# Patient Record
Sex: Male | Born: 1990 | Race: White | Hispanic: No | Smoking: Never smoker
Health system: Southern US, Community
[De-identification: ages and names within clinical notes are randomized; demographics above are authoritative.]

## PROBLEM LIST (undated history)

## (undated) DIAGNOSIS — M1990 Unspecified osteoarthritis, unspecified site: Secondary | ICD-10-CM

## (undated) DIAGNOSIS — J020 Streptococcal pharyngitis: Secondary | ICD-10-CM

---
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
## 2011-07-22 ENCOUNTER — Emergency Department: Payer: Self-pay | Admitting: Emergency Medicine

## 2011-07-27 ENCOUNTER — Emergency Department: Payer: Self-pay | Admitting: *Deleted

## 2011-09-29 ENCOUNTER — Emergency Department: Payer: Self-pay | Admitting: Emergency Medicine

## 2011-11-19 ENCOUNTER — Ambulatory Visit: Payer: Self-pay | Admitting: Physical Medicine and Rehabilitation

## 2012-09-27 ENCOUNTER — Emergency Department: Payer: Self-pay | Admitting: Emergency Medicine

## 2014-01-10 ENCOUNTER — Emergency Department: Payer: Self-pay | Admitting: Emergency Medicine

## 2014-09-12 DIAGNOSIS — R0989 Other specified symptoms and signs involving the circulatory and respiratory systems: Secondary | ICD-10-CM | POA: Insufficient documentation

## 2014-09-12 NOTE — ED Notes (Signed)
Pt to ED states that he "feels like something is in his throat." pt denies a sore throat, difficulty breathing, or difficulty swallowing.

## 2014-09-13 ENCOUNTER — Emergency Department
Admission: EM | Admit: 2014-09-13 | Discharge: 2014-09-13 | Discharge status: Against Medical Advice | Payer: Self-pay | Attending: Emergency Medicine | Admitting: Emergency Medicine

## 2014-09-13 ENCOUNTER — Encounter: Payer: Self-pay | Admitting: *Deleted

## 2014-09-13 ENCOUNTER — Emergency Department: Payer: Self-pay

## 2014-09-13 ENCOUNTER — Emergency Department
Admission: EM | Admit: 2014-09-13 | Discharge: 2014-09-13 | Disposition: A | Discharge status: Home / Self Care | Payer: Self-pay | Attending: Emergency Medicine | Admitting: Emergency Medicine

## 2014-09-13 DIAGNOSIS — F458 Other somatoform disorders: Secondary | ICD-10-CM | POA: Insufficient documentation

## 2014-09-13 DIAGNOSIS — R0989 Other specified symptoms and signs involving the circulatory and respiratory systems: Secondary | ICD-10-CM

## 2014-09-13 HISTORY — DX: Streptococcal pharyngitis: J02.0

## 2014-09-13 HISTORY — DX: Unspecified osteoarthritis, unspecified site: M19.90

## 2014-09-13 LAB — POCT RAPID STREP A: STREPTOCOCCUS, GROUP A SCREEN (DIRECT): NEGATIVE

## 2014-09-13 NOTE — ED Notes (Signed)
Obtained rapid strep study in triage. Test negative.  sample also sent to lab

## 2014-09-13 NOTE — ED Provider Notes (Signed)
[facility] Emergency Department Provider Note ____________________________________________  Time seen: 1048  I have reviewed the triage vital signs and the nursing notes.  HISTORY  Chief Complaint  Foreign Body  HPI Jonathan Hines is a 24 y.o. male who reports to the ED with 2 days complaint of fullness to the throat. He he describes a foreign body sensation to the left side of his throat, below his Adam's apple. He denies that the area is painful, but it feels as though there is something hanging in his throat. He reports at times he can cough and the foreign body seems to rise up to just behind the tongue, but he is unable to expectorate this foreign body. He denies any recent fevers, chills, sweats, or cough. He has not had any difficulty controlling his secretions, eating, or drinking. He has taken any particular medicines for this complaint and notes only temporary relief with warm milk.  Past Medical History  Diagnosis Date  . Strep throat   . Arthritis    There are no active problems to display for this patient.  No past surgical history on file.  No current outpatient prescriptions on file.  Allergies Review of patient's allergies indicates no known allergies.  No family history on file.  Social History History  Substance Use Topics  . Smoking status: Never Smoker   . Smokeless tobacco: Not on file  . Alcohol Use: Yes   Review of Systems  Constitutional: Negative for fever. Eyes: Negative for visual changes. ENT: Negative for sore throat. FBS as above Cardiovascular: Negative for chest pain. Respiratory: Negative for shortness of breath. Gastrointestinal: Negative for abdominal pain, vomiting and diarrhea. Genitourinary: Negative for dysuria. Musculoskeletal: Negative for back pain. Skin: Negative for rash. Neurological: Negative for headaches, focal weakness or numbness. ____________________________________________  PHYSICAL  EXAM:  VITAL SIGNS: ED Triage Vitals  Enc Vitals Group     BP 09/13/14 1002 134/80 mmHg     Pulse Rate 09/13/14 1002 60     Resp 09/13/14 1002 18     Temp 09/13/14 1002 98.1 F (36.7 C)     Temp Source 09/13/14 1002 Oral     SpO2 09/13/14 1002 99 %     Weight 09/13/14 1002 187 lb (84.823 kg)     Height 09/13/14 1002  (1.88 m)     Head Cir --      Peak Flow --      Pain Score --      Pain Loc --      Pain Edu? --      Excl. in GC? --    Constitutional: Alert and oriented. Well appearing and in no distress. Eyes: Conjunctivae are normal. PERRL. Normal extraocular movements. ENT   Head: Normocephalic and atraumatic.   Nose: No congestion/rhinnorhea.   Mouth/Throat: Mucous membranes are moist. Uvula is midline without edema. Tonsils are large, but without edema, exudate, or injection.    Neck: Supple. No thyromegaly. Hematological/Lymphatic/Immunilogical: No cervical lymphadenopathy. Cardiovascular: Normal rate, regular rhythm.  Respiratory: Normal respiratory effort. No wheezes/rales/rhonchi. Gastrointestinal: Soft and nontender. No distention. Musculoskeletal: Nontender with normal range of motion in all extremities.  Neurologic:  CN II-XII grossly intact. Normal gait without ataxia. Normal speech and language. No gross focal neurologic deficits are appreciated. Skin:  Skin is warm, dry and intact. No rash noted. Psychiatric: Mood and affect are normal. Patient exhibits appropriate insight and judgment. ____________________________________________   RADIOLOGY  Soft Tissue Neck IMPRESSION: Negative.  I, Munirah Doerner,  Charlesetta Ivory, personally viewed and evaluated these images as part of my medical decision making.  ____________________________________________  INITIAL IMPRESSION / ASSESSMENT AND PLAN / ED COURSE  Radiology results to patient. Reassurance about likely spontaneous resolution of globus sensation. Within normal limits exam without evidence of  dysphagia, abscess, or aspiration. Suggest follow-up with ENT if symptoms worsen.  ____________________________________________  FINAL CLINICAL IMPRESSION(S) / ED DIAGNOSES  Final diagnoses:  Globus sensation     Lissa Hoard, PA-C 09/13/14 1159  Emily Filbert, MD 09/13/14 [phone]

## 2014-09-13 NOTE — ED Notes (Signed)
Pt states he had an infection in his throat that started 6 days ago. Pt states that he started feeling better. Pt states the past two days he now has something stuck in his throat. Pt denies knowing what could be stuck. Pt denies eating fish.

## 2014-09-13 NOTE — Discharge Instructions (Signed)
Globus Syndrome Globus Syndrome is a feeling of a lump or a sensation of something caught in your throat. Eating food or drinking fluids does not seem to get rid of it. Yet it is not noticeable during the actual act of swallowing food or liquids. Usually there is nothing physically wrong. It is troublesome because it is an unpleasant sensation which is sometimes difficult to ignore and at times may seem to worsen. The syndrome is quite common. It is estimated 45% of the population experiences features of the condition at some stage during their lives. The symptoms are usually temporary. The largest group of people who feel the need to seek medical treatment is females between the ages of 65 to 30.  CAUSES  Globus Syndrome appears to be triggered by or aggravated by stress, anxiety and depression.  Tension related to stress could product abnormal muscle spasms in the esophagus which would account for the sensation of a lump or ball in your throat.  Frequent swallowing or drying of the throat caused by anxiety or other strong emotions can also produce this uncomfortable sensation in your throat.  Fear and sadness can be expressed by the body in many ways. For instance, if you had a relative with throat cancer you might become overly concerned about your own health and develop uncomfortable sensations in your throat.  The reaction to a crisis or a trauma event in your life can take the form of a lump in your throat. It is as if you are indirectly saying you can not handle or "swallow" one more thing. DIAGNOSIS  Usually your caregiver will know what is wrong by talking to you and examining you. If the condition persists for several days, more testing may be done to make sure there is not another problem present. This is usually not the case. TREATMENT   Reassurance is often the best treatment available. Usually the problem leaves without treatment over several days.  Sometimes anti-anxiety medications  may be prescribed.  Counseling or talk therapy can also help with strong underlying emotions.  Note that in most cases this is not something that keeps coming back and you should not be concerned or worried. Document Released: 04/16/2003 Document Revised: 04/18/2011 Document Reviewed: 09/13/2007 [facility] Patient Information 2015 [place], [place]. This information is not intended to replace advice given to you by your health care provider. Make sure you discuss any questions you have with your health care provider.  Your exam and x-ray are normal today.  Your symptoms will likely resolve without treatment. Return to the ED for worsening pain, or difficulty swallowing.

## 2014-09-16 LAB — CULTURE, GROUP A STREP (THRC)

## 2017-06-04 IMAGING — CR DG NECK SOFT TISSUE
1 series · 2 of 2 positions shown · non-contrast
Comparison: None.

CLINICAL DATA: Pt states he feels a lump on the inside of his
throat on the left side x 2 days, no difficulty swallowing, no fever

EXAM:
NECK SOFT TISSUES - 1+ VIEW

[Series 1: dg neck soft tissue · 0.14mm/px · 2 of 2 slices shown]
[im 1/2]
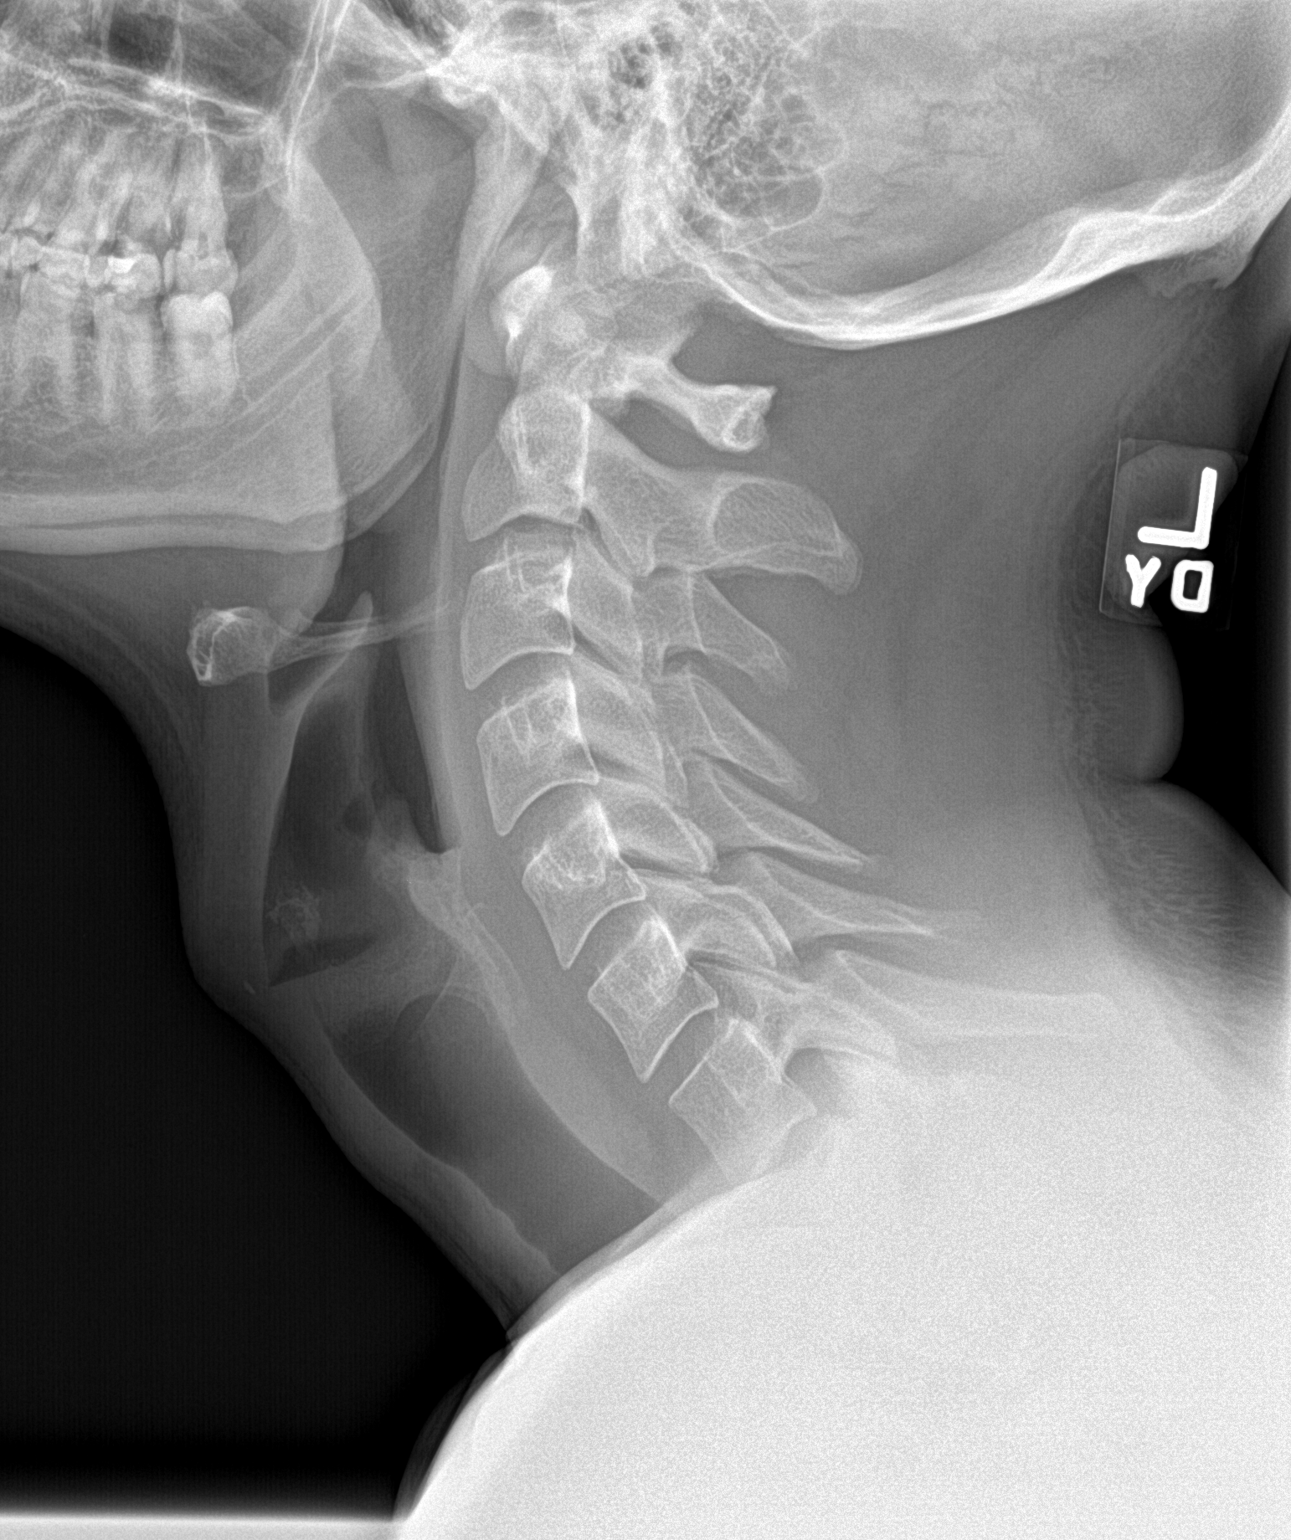
[im 2/2]
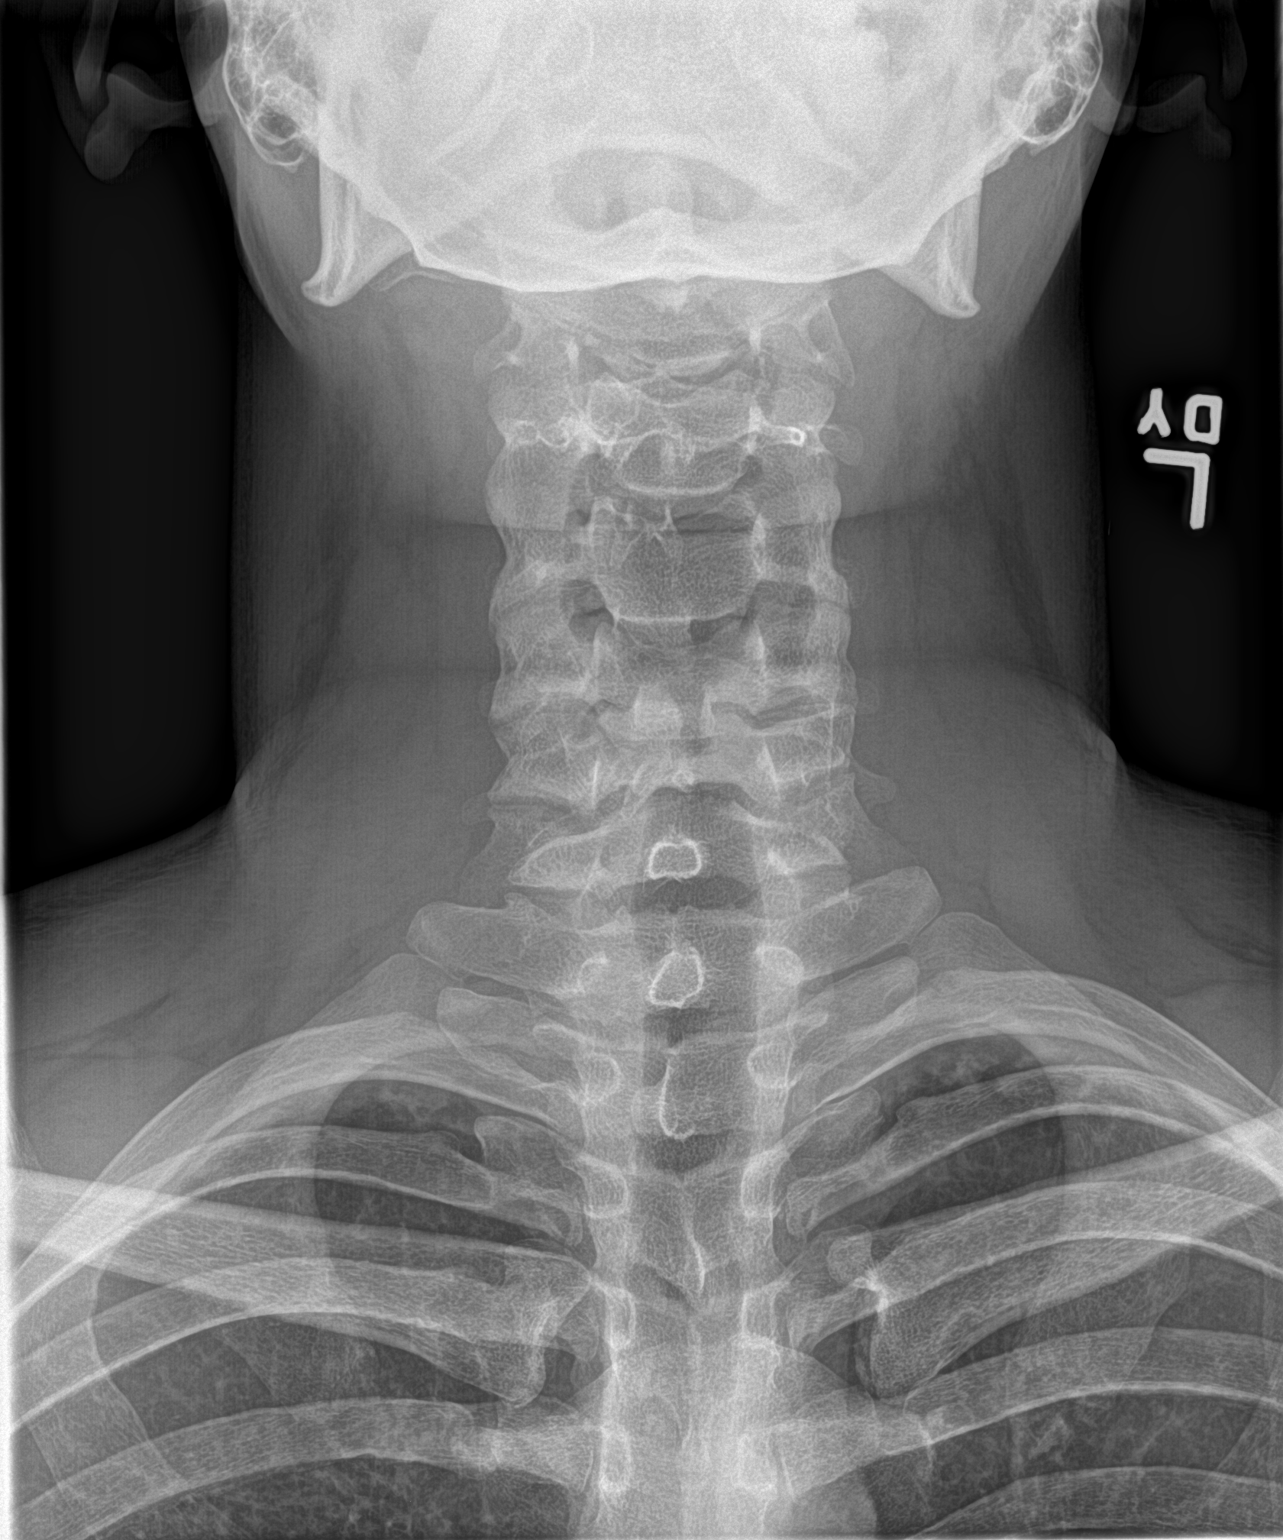

[2 of 2 positions shown; findings below may reference images not displayed]

FINDINGS: There is no evidence of retropharyngeal soft tissue swelling or
epiglottic enlargement. The cervical airway is unremarkable and no
radio-opaque foreign body identified.
IMPRESSION: Negative.

## 2020-05-12 ENCOUNTER — Emergency Department
Admission: EM | Admit: 2020-05-12 | Discharge: 2020-05-12 | Disposition: A | Discharge status: Home / Self Care | Payer: Self-pay | Attending: Emergency Medicine | Admitting: Emergency Medicine

## 2020-05-12 ENCOUNTER — Encounter: Payer: Self-pay | Admitting: Emergency Medicine

## 2020-05-12 ENCOUNTER — Other Ambulatory Visit: Payer: Self-pay

## 2020-05-12 DIAGNOSIS — K029 Dental caries, unspecified: Secondary | ICD-10-CM | POA: Insufficient documentation

## 2020-05-12 MED ORDER — LIDOCAINE VISCOUS HCL 2 % MT SOLN
15.0000 mL | Freq: Once | OROMUCOSAL | Status: AC
Start: 2020-05-12 — End: 2020-05-12
  Administered 2020-05-12: 15 mL via OROMUCOSAL
  Filled 2020-05-12: qty 15

## 2020-05-12 MED ORDER — AMOXICILLIN-POT CLAVULANATE 875-125 MG PO TABS
1.0000 | ORAL_TABLET | Freq: Once | ORAL | Status: AC
  Administered 2020-05-12: 1 via ORAL
  Filled 2020-05-12: qty 1

## 2020-05-12 MED ORDER — MAGIC MOUTHWASH W/LIDOCAINE: 5.0000 mL | Freq: Four times a day (QID) | ORAL | 0 refills | Status: DC | PRN

## 2020-05-12 MED ORDER — IBUPROFEN 600 MG PO TABS
600.0000 mg | ORAL_TABLET | Freq: Once | ORAL | Status: AC
  Administered 2020-05-12: 600 mg via ORAL
  Filled 2020-05-12: qty 1

## 2020-05-12 MED ORDER — AMOXICILLIN-POT CLAVULANATE 875-125 MG PO TABS: 1.0000 | ORAL_TABLET | Freq: Two times a day (BID) | ORAL | 0 refills | Status: AC

## 2020-05-12 NOTE — ED Triage Notes (Signed)
Pt in with co toothache that started today

## 2020-05-12 NOTE — ED Notes (Signed)
Pt assessed by provider prior to d.c. Pt signed paper copy of understanding of d/c, medications, and follow up information provided by this RN.

## 2020-05-12 NOTE — ED Provider Notes (Signed)
[facility] Emergency Department Provider Note  ____________________________________________   Event Date/Time   First MD Initiated Contact with Patient 05/12/20 0259     (approximate)  I have reviewed the triage vital signs and the nursing notes.   HISTORY  Chief Complaint Dental Pain    HPI Jonathan Hines is a 30 y.o. male presents for evaluation of acute onset pain in the left bottom rear molar.  He has had issues in the past with his teeth.  He currently does not have  a dentist.  He said that the pain is sharp and aching and has been present for 1 day.  No swelling, no difficulty swallowing, no sore throat, no difficulty speaking.  Pain has improved somewhat with Tylenol, made worse with hot and cold.        Past Medical History:  Diagnosis Date  . Arthritis   . Strep throat     There are no problems to display for this patient.   History reviewed. No pertinent surgical history.  Prior to Admission medications   Medication Sig Start Date End Date Taking? Authorizing Provider  amoxicillin-clavulanate (AUGMENTIN) 875-125 MG tablet Take 1 tablet by mouth every 12 (twelve) hours for 10 days. 05/12/20 05/22/20 Yes Loleta Rose, MD  magic mouthwash w/lidocaine SOLN Take 5 mLs by mouth 4 (four) times daily as needed for mouth pain. Swish and spit, do not swallow the solution. 05/12/20  Yes Loleta Rose, MD    Allergies Patient has no known allergies.  History reviewed. No pertinent family history.  Social History Social History   Tobacco Use  . Smoking status: Never Smoker  . Smokeless tobacco: Never Used  Substance Use Topics  . Alcohol use: Yes  . Drug use: No    Review of Systems Constitutional: No fever/chills ENT: Dental pain as described above.  No swelling nor dysphagia. Cardiovascular: Denies chest pain. Respiratory: Denies shortness of breath. Gastrointestinal: No abdominal pain.  No nausea, no vomiting.   Integumentary:  Negative for rash. Neurological: Negative for headaches, focal weakness or numbness.   ____________________________________________   PHYSICAL EXAM:  VITAL SIGNS: ED Triage Vitals  Enc Vitals Group     BP 05/12/20 0206 (!) 159/103     Pulse Rate 05/12/20 0206 88     Resp 05/12/20 0206 20     Temp 05/12/20 0206 98.3 F (36.8 C)     Temp Source 05/12/20 0206 Oral     SpO2 05/12/20 0206 100 %     Weight 05/12/20 0205 90.7 kg (200 lb)     Height 05/12/20 0205 1.905 m (6\' 3" )     Head Circumference --      Peak Flow --      Pain Score 05/12/20 0204 10     Pain Loc --      Pain Edu? --      Excl. in GC? --     Constitutional: Alert and oriented.  Eyes: Conjunctivae are normal.  Head: Atraumatic. Nose: No congestion/rhinnorhea. Mouth/Throat: No oral nor pharyngeal swelling.  Patient has some chronic dental decay and his left bottom rear molar appears somewhat impacted and is tender to the touch.  There is a small amount of surrounding erythema but no drainable abscess.  No evidence of Ludwig's angina, peritonsillar abscess, nor other emergent condition. Neck: No stridor.  No meningeal signs.   Cardiovascular: Normal rate, regular rhythm. Good peripheral circulation. Respiratory: Normal respiratory effort.  No retractions. Neurologic:  Normal speech and  language. No gross focal neurologic deficits are appreciated.  Skin:  Skin is warm, dry and intact. Psychiatric: Mood and affect are normal. Speech and behavior are normal.  ____________________________________________   LABS (all labs ordered are listed, but only abnormal results are displayed)  Labs Reviewed - No data to display ____________________________________________    INITIAL IMPRESSION / MDM / ASSESSMENT AND PLAN / ED COURSE  As part of my medical decision making, I reviewed the following data within the electronic MEDICAL RECORD NUMBER Nursing notes reviewed and incorporated, Old chart reviewed, Notes from prior ED  visits and [place] Controlled Substance Database   Dental pain likely due to chronic dental caries and a slightly impacted left rear bottom tooth.  No drainable abscess.  Ibuprofen, viscous lidocaine and mouth rinse, Augmentin for possible surrounding infection, and dental resource packet.  Gave usual and customary return precautions.         ____________________________________________  FINAL CLINICAL IMPRESSION(S) / ED DIAGNOSES  Final diagnoses:  Pain due to dental caries     MEDICATIONS GIVEN DURING THIS VISIT:  Medications  lidocaine (XYLOCAINE) 2 % viscous mouth solution 15 mL (15 mLs Mouth/Throat Given 05/12/20 0322)  amoxicillin-clavulanate (AUGMENTIN) 875-125 MG per tablet 1 tablet (1 tablet Oral Given 05/12/20 0321)  ibuprofen (ADVIL) tablet 600 mg (600 mg Oral Given 05/12/20 0321)     ED Discharge Orders         Ordered    amoxicillin-clavulanate (AUGMENTIN) 875-125 MG tablet  Every 12 hours        05/12/20 0305    magic mouthwash w/lidocaine SOLN  4 times daily PRN       Note to Pharmacy: Please mix viscous lidocaine 2% with magic mouthwash solution so that the lidocaine comprises approximately 25 % of the total solution.   05/12/20 0305          *Please note:  Jonathan Hines was evaluated in Emergency Department on 05/12/2020 for the symptoms described in the history of present illness. He was evaluated in the context of the global COVID-19 pandemic, which necessitated consideration that the patient might be at risk for infection with the SARS-CoV-2 virus that causes COVID-19. Institutional protocols and algorithms that pertain to the evaluation of patients at risk for COVID-19 are in a state of rapid change based on information released by regulatory bodies including the CDC and federal and state organizations. These policies and algorithms were followed during the patient's care in the ED.  Some ED evaluations and interventions may be delayed as a result of limited staffing  during and after the pandemic.*  Note:  This document was prepared using Dragon voice recognition software and may include unintentional dictation errors.   Loleta Rose, MD 05/12/20 [phone]

## 2020-05-12 NOTE — Discharge Instructions (Addendum)

## 2022-10-21 ENCOUNTER — Ambulatory Visit: Payer: Self-pay | Admitting: Family Medicine

## 2022-10-21 DIAGNOSIS — Z0000 Encounter for general adult medical examination without abnormal findings: Secondary | ICD-10-CM | POA: Insufficient documentation

## 2022-10-21 NOTE — Progress Notes (Deleted)
es   New patient visit   Patient: Jonathan Hines   DOB: 04-21-1990   32 y.o. Male  MRN: [id number] Visit Date: 10/21/2022  Today's healthcare provider: Sherlyn Hay, DO   No chief complaint on file.  Subjective    Jonathan Hines is a 32 y.o. male who presents today as a new patient to establish care.  HPI   Tdap? Flu? Covid? Tobacco? None.  General feels:  Diet: Exercise: goes to the gym still?  GAD-7 PHQ9  ***  Past Medical History:  Diagnosis Date   Arthritis    Strep throat    No past surgical history on file. No family status information on file.   No family history on file. Social History   Socioeconomic History   Marital status: Single    Spouse name: Not on file   Number of children: Not on file   Years of education: Not on file   Highest education level: Not on file  Occupational History   Not on file  Tobacco Use   Smoking status: Never   Smokeless tobacco: Never  Substance and Sexual Activity   Alcohol use: Yes   Drug use: No   Sexual activity: Not on file  Other Topics Concern   Not on file  Social History Narrative   Not on file   Social Determinants of Health   Financial Resource Strain: Not on file  Food Insecurity: Not on file  Transportation Needs: Not on file  Physical Activity: Not on file  Stress: Not on file  Social Connections: Not on file   Outpatient Medications Prior to Visit  Medication Sig   magic mouthwash w/lidocaine SOLN Take 5 mLs by mouth 4 (four) times daily as needed for mouth pain. Swish and spit, do not swallow the solution.   No facility-administered medications prior to visit.   No Known Allergies   There is no immunization history on file for this patient.  Health Maintenance  Topic Date Due   HIV Screening  Never done   Hepatitis C Screening  Never done   DTaP/Tdap/Td (1 - Tdap) Never done   INFLUENZA VACCINE  Never done   COVID-19 Vaccine (1 - 2023-24 season) Never done   HPV VACCINES   Aged Out    Patient Care Team: Pcp, No as PCP - General  Review of Systems  {Insert previous labs (optional):23779} {See past labs  Heme  Chem  Endocrine  Serology  Results Review (optional):1}   Objective    There were no vitals taken for this visit. {Insert last BP/Wt (optional):23777}{See vitals history (optional):1}   Physical Exam ***  Depression Screen     No data to display         No results found for any visits on 10/21/22.  Assessment & Plan     Encounter for medical examination to establish care     ***  No follow-ups on file.     I discussed the assessment and treatment plan with the patient  The patient was provided an opportunity to ask questions and all were answered. The patient agreed with the plan and demonstrated an understanding of the instructions.   The patient was advised to call back or seek an in-person evaluation if the symptoms worsen or if the condition fails to improve as anticipated.    Sherlyn Hay, DO  [facility] [phone] (phone) [phone] (fax)  [facility]

## 2022-10-21 NOTE — Assessment & Plan Note (Deleted)
GAD7 score: PHQ9 score:

## 2023-04-13 LAB — HM HEPATITIS C SCREENING LAB: HM Hepatitis Screen: NEGATIVE

## 2023-04-13 LAB — HM HIV SCREENING LAB: HM HIV Screening: NEGATIVE

## 2023-05-15 ENCOUNTER — Ambulatory Visit (HOSPITAL_BASED_OUTPATIENT_CLINIC_OR_DEPARTMENT_OTHER): Payer: Self-pay | Admitting: Family Medicine

## 2023-10-30 ENCOUNTER — Ambulatory Visit (HOSPITAL_BASED_OUTPATIENT_CLINIC_OR_DEPARTMENT_OTHER): Admitting: Family Medicine

## 2023-11-20 ENCOUNTER — Ambulatory Visit (HOSPITAL_BASED_OUTPATIENT_CLINIC_OR_DEPARTMENT_OTHER): Admitting: Family Medicine

## 2023-12-06 ENCOUNTER — Ambulatory Visit (HOSPITAL_BASED_OUTPATIENT_CLINIC_OR_DEPARTMENT_OTHER): Admitting: Family Medicine

## 2023-12-30 LAB — BASIC METABOLIC PANEL WITH GFR
BUN: 16 (ref 4–21)
Creatinine: 1.3 (ref 0.6–1.3)
EGFR: 74
Glucose: 81
Sodium: 142 (ref 137–147)

## 2023-12-30 LAB — CBC: RBC: 5.65 — AB (ref 3.87–5.11)

## 2023-12-30 LAB — IRON,TIBC AND FERRITIN PANEL
Ferritin: 158
Iron: 108

## 2023-12-30 LAB — TSH: TSH: 1.45 (ref 0.41–5.90)

## 2023-12-30 LAB — CBC AND DIFFERENTIAL
HCT: 52 (ref 41–53)
Hemoglobin: 17.3 (ref 13.5–17.5)
WBC: 6.9

## 2023-12-30 LAB — VITAMIN B12: Vitamin B-12: 565

## 2023-12-30 LAB — TESTOSTERONE: Testosterone: 364

## 2024-01-01 ENCOUNTER — Ambulatory Visit: Admitting: General Practice

## 2024-01-22 ENCOUNTER — Encounter: Payer: Self-pay | Admitting: General Practice

## 2024-01-22 ENCOUNTER — Ambulatory Visit: Payer: Self-pay | Admitting: General Practice

## 2024-01-22 ENCOUNTER — Ambulatory Visit: Admitting: General Practice

## 2024-01-22 VITALS — BP 118/80 | HR 84 | Temp 97.8°F | Ht 74.0 in | Wt 210.0 lb

## 2024-01-22 DIAGNOSIS — N529 Male erectile dysfunction, unspecified: Secondary | ICD-10-CM | POA: Insufficient documentation

## 2024-01-22 DIAGNOSIS — Z7689 Persons encountering health services in other specified circumstances: Secondary | ICD-10-CM | POA: Insufficient documentation

## 2024-01-22 DIAGNOSIS — R5383 Other fatigue: Secondary | ICD-10-CM | POA: Insufficient documentation

## 2024-01-22 DIAGNOSIS — Z23 Encounter for immunization: Secondary | ICD-10-CM

## 2024-01-22 DIAGNOSIS — E559 Vitamin D deficiency, unspecified: Secondary | ICD-10-CM

## 2024-01-22 LAB — COMPREHENSIVE METABOLIC PANEL WITH GFR
ALT: 19 U/L (ref 0–53)
AST: 13 U/L (ref 0–37)
Albumin: 4.5 g/dL (ref 3.5–5.2)
Alkaline Phosphatase: 62 U/L (ref 39–117)
BUN: 11 mg/dL (ref 6–23)
CO2: 28 meq/L (ref 19–32)
Calcium: 9 mg/dL (ref 8.4–10.5)
Chloride: 104 meq/L (ref 96–112)
Creatinine, Ser: 1.08 mg/dL (ref 0.40–1.50)
GFR: 90.27 mL/min (ref >60.00)
Glucose, Bld: 86 mg/dL (ref 70–99)
Potassium: 3.9 meq/L (ref 3.5–5.1)
Sodium: 139 meq/L (ref 135–145)
Total Bilirubin: 0.5 mg/dL (ref 0.2–1.2)
Total Protein: 6.7 g/dL (ref 6.0–8.3)

## 2024-01-22 LAB — HEMOGLOBIN A1C: Hgb A1c MFr Bld: 5 % (ref 4.6–6.5)

## 2024-01-22 LAB — TSH: TSH: 1.43 u[IU]/mL (ref 0.35–5.50)

## 2024-01-22 LAB — VITAMIN D 25 HYDROXY (VIT D DEFICIENCY, FRACTURES): VITD: 17.5 ng/mL — ABNORMAL LOW (ref 30.00–100.00)

## 2024-01-22 LAB — CBC
HCT: 42.8 % (ref 39.0–52.0)
Hemoglobin: 15.2 g/dL (ref 13.0–17.0)
MCHC: 35.5 g/dL (ref 30.0–36.0)
MCV: 87 fl (ref 78.0–100.0)
Platelets: 228 K/uL (ref 150.0–400.0)
RBC: 4.91 Mil/uL (ref 4.22–5.81)
RDW: 12.3 % (ref 11.5–15.5)
WBC: 6.9 K/uL (ref 4.0–10.5)

## 2024-01-22 LAB — VITAMIN B12: Vitamin B-12: 405 pg/mL (ref 211–911)

## 2024-01-22 NOTE — Assessment & Plan Note (Signed)
 EMR reviewed briefly.

## 2024-01-22 NOTE — Patient Instructions (Addendum)
 Stop by the lab prior to leaving today. I will notify you of your results once received.    Follow up in 2 weeks for physical and fasting labs.   It was a pleasure to meet you today! Please don't hesitate to contact me with any questions. Welcome to [facility]!

## 2024-01-22 NOTE — Telephone Encounter (Signed)
 Copied from CRM #[id number]. Topic: General - Other >> Jan 22, 2024  3:30 PM Frederich PARAS wrote: Reason for CRM: pt called to give pharmacy info to [employer].   [facility] in [place] [address]

## 2024-01-22 NOTE — Progress Notes (Signed)
 New Patient Office Visit  Subjective    Patient ID: Jonathan Hines, male    DOB: March 14, 1990  Age: 33 y.o. MRN: [id number]  CC:  Chief Complaint  Patient presents with   New Patient (Initial Visit)    Establish care   Fatigue    With tension aches since April.    Erectile Dysfunction    Since around may off and on. Wants to check testosterone .     HPI Jonathan Hines is a 33 y.o. male presents to establish care.  Previous PCP/physical/labs: no recent PCP/physical. Last labs- in [place] last month.   Discussed the use of AI scribe software for clinical note transcription with the patient, who gave verbal consent to proceed.  History of Present Illness Jonathan Hines is a 33 year old male who presents with fatigue, tension aches, and headaches since April.   He has been experiencing fatigue, tension aches, and headaches since April. The headaches are sometimes described as tension in the back of the neck and occasionally occur on the right or left side. He notes a sore spot behind his ear that he rubs to alleviate discomfort. He wakes up feeling tired, which is a change from the previous year when he was more active. He experiences good days followed by days where he feels unwell.  His sleep schedule varies, going to bed between 9:30 PM and 2:30 AM and waking up between 5 and 6 AM. He wakes up to use the bathroom but does not have difficulty falling back asleep. He occasionally snores lightly but does not experience gasping for air.  He is concerned about his testosterone  levels, which were tested at a Medicare facility in [place], [place] , showing a total testosterone  level of 376 ng/dL. Free testosterone  was not tested.  He reports a decrease in the strength of his urine stream but denies any burning, hesitancy, urgency, flank pain, or blood in urine. He wakes up at night to urinate.  In March, he experienced an episode of dizziness, sweating, and nausea while at work, but  this has not recurred.  He acknowledges having a hard year, which may have impacted his mood and desire, noting periods without any desire.  No fever, chills, chest pain, shortness of breath, difficulty breathing, heartburn, nausea, vomiting, diarrhea, constipation, abdominal pain, blood in stool, or dizziness.     No outpatient encounter medications on file as of 01/22/2024.   No facility-administered encounter medications on file as of 01/22/2024.    Past Medical History:  Diagnosis Date   Arthritis    Strep throat     History reviewed. No pertinent surgical history.  History reviewed. No pertinent family history.  Social History   Socioeconomic History   Marital status: Single    Spouse name: Not on file   Number of children: Not on file   Years of education: Not on file   Highest education level: Not on file  Occupational History   Not on file  Tobacco Use   Smoking status: Never   Smokeless tobacco: Never  Substance and Sexual Activity   Alcohol use: Yes   Drug use: No   Sexual activity: Not on file  Other Topics Concern   Not on file  Social History Narrative   Not on file   Social Drivers of Health   Tobacco Use: Low Risk (01/22/2024)   Patient History    Smoking Tobacco Use: Never    Smokeless Tobacco Use: Never  Passive Exposure: Not on file  Financial Resource Strain: Not on file  Food Insecurity: Not on file  Transportation Needs: Not on file  Physical Activity: Not on file  Stress: Not on file  Social Connections: Not on file  Intimate Partner Violence: Not on file  Depression (PHQ2-9): Medium Risk (01/22/2024)   Depression (PHQ2-9)    PHQ-2 Score: 5  Alcohol Screen: Not on file  Housing: Not on file  Utilities: Not on file  Health Literacy: Not on file    Review of Systems  Constitutional:  Positive for malaise/fatigue. Negative for chills and fever.  Respiratory:  Negative for shortness of breath.   Cardiovascular:  Negative for  chest pain.  Gastrointestinal:  Negative for abdominal pain, constipation, diarrhea, heartburn, nausea and vomiting.  Genitourinary:  Negative for dysuria, frequency and urgency.  Neurological:  Negative for dizziness and headaches.  Endo/Heme/Allergies:  Negative for polydipsia.  Psychiatric/Behavioral:  Negative for depression and suicidal ideas. The patient is not nervous/anxious.         Objective    BP 118/80   Pulse 84   Temp 97.8 F (36.6 C) (Temporal)   Ht 6' 2 (1.88 m)   Wt 210 lb (95.3 kg)   SpO2 98%   BMI 26.96 kg/m   Physical Exam Vitals and nursing note reviewed.  Constitutional:      Appearance: Normal appearance.  Eyes:     Conjunctiva/sclera: Conjunctivae normal.  Cardiovascular:     Rate and Rhythm: Normal rate and regular rhythm.     Pulses: Normal pulses.     Heart sounds: Normal heart sounds.  Pulmonary:     Effort: Pulmonary effort is normal.     Breath sounds: Normal breath sounds.  Neurological:     Mental Status: He is alert and oriented to person, place, and time.  Psychiatric:        Mood and Affect: Mood normal.        Behavior: Behavior normal.        Thought Content: Thought content normal.        Judgment: Judgment normal.         Assessment & Plan:  Fatigue, unspecified type -     CBC -     Testosterone ,Free and Total -     Comprehensive metabolic panel with GFR -     Hemoglobin A1c -     TSH -     VITAMIN D  25 Hydroxy (Vit-D Deficiency, Fractures) -     Vitamin B12  Establishing care with new doctor, encounter for Assessment & Plan: EMR reviewed briefly.     Erectile dysfunction, unspecified erectile dysfunction type  Need for Tdap vaccination -     Tdap vaccine greater than or equal to 7yo IM    Assessment and Plan Assessment & Plan Fatigue and tension-type headaches Intermittent fatigue and headaches possibly due to hormonal imbalance, anemia, vitamin deficiencies, thyroid  dysfunction, or low testosterone . -  Ordered lab work for anemia, vitamin levels, thyroid  function, and testosterone . - Consider urology referral if testosterone  is low. - Advised monthly massages for tension relief.  Erectile dysfunction Intermittent ED possibly due to hormonal imbalance, psychological factors, or vascular issues. Discussed testosterone  therapy risks and non-medication options. - Ordered testosterone  level check. - Advised logging erectile function and desire. - Consider urology referral if hormonal imbalance is indicated.  General health maintenance Due for tetanus vaccination. No recent HPV vaccination history. Declined flu shot. - Administered tetanus vaccine. - Discussed HPV vaccination  history and will consider if not up to date.   Return in about 2 weeks (around 02/05/2024) for physical and fasting labs. SABRA Carrol [place], NP

## 2024-01-23 LAB — TESTOSTERONE,FREE AND TOTAL
Testosterone, Free: 10.6 pg/mL (ref 8.7–25.1)
Testosterone: 359 ng/dL (ref 264–916)

## 2024-01-23 MED ORDER — VITAMIN D (ERGOCALCIFEROL) 1.25 MG (50000 UNIT) PO CAPS: 50000.0000 [IU] | ORAL_CAPSULE | ORAL | 1 refills | Status: AC

## 2024-01-23 NOTE — Addendum Note (Signed)
 Addended by: VINCENTE SHIVERS on: 01/23/2024 09:36 AM   Modules accepted: Orders

## 2024-01-24 NOTE — Addendum Note (Signed)
 Addended by: VINCENTE SHIVERS on: 01/24/2024 02:08 PM   Modules accepted: Orders

## 2024-01-24 NOTE — Telephone Encounter (Signed)
 Labs printed and will abstract.

## 2024-01-26 ENCOUNTER — Encounter: Payer: Self-pay | Admitting: General Practice

## 2024-03-08 ENCOUNTER — Encounter: Admitting: General Practice

## 2024-03-08 DIAGNOSIS — Z0000 Encounter for general adult medical examination without abnormal findings: Secondary | ICD-10-CM

## 2024-04-22 ENCOUNTER — Encounter: Admitting: General Practice
